# Patient Record
Sex: Male | Born: 1975 | Race: White | Hispanic: Yes | Marital: Married | Smoking: Never smoker
Health system: Southern US, Community
[De-identification: ages and names within clinical notes are randomized; demographics above are authoritative.]

---

## 2007-03-20 ENCOUNTER — Ambulatory Visit: Payer: Self-pay | Admitting: Family Medicine

## 2007-03-22 ENCOUNTER — Ambulatory Visit: Payer: Self-pay | Admitting: *Deleted

## 2007-10-25 ENCOUNTER — Ambulatory Visit: Payer: Self-pay | Admitting: Internal Medicine

## 2007-11-17 ENCOUNTER — Ambulatory Visit: Payer: Self-pay | Admitting: Internal Medicine

## 2007-12-13 ENCOUNTER — Encounter: Admission: RE | Admit: 2007-12-13 | Discharge: 2008-01-02 | Payer: Self-pay | Admitting: Family Medicine

## 2009-01-08 ENCOUNTER — Ambulatory Visit: Payer: Self-pay | Admitting: Internal Medicine

## 2009-03-10 ENCOUNTER — Ambulatory Visit (HOSPITAL_COMMUNITY): Admission: RE | Admit: 2009-03-10 | Discharge: 2009-03-10 | Payer: Self-pay | Admitting: Internal Medicine

## 2009-05-09 ENCOUNTER — Ambulatory Visit: Payer: Self-pay | Admitting: Internal Medicine

## 2010-04-23 IMAGING — CR DG THORACIC SPINE 2V
4 series · 4 of 4 positions shown · non-contrast
Comparison: None

CLINICAL DATA: Chronic low T-spine pain.  No known injury.

THORACIC SPINE - 2 VIEW

[t t-spine a.p.]
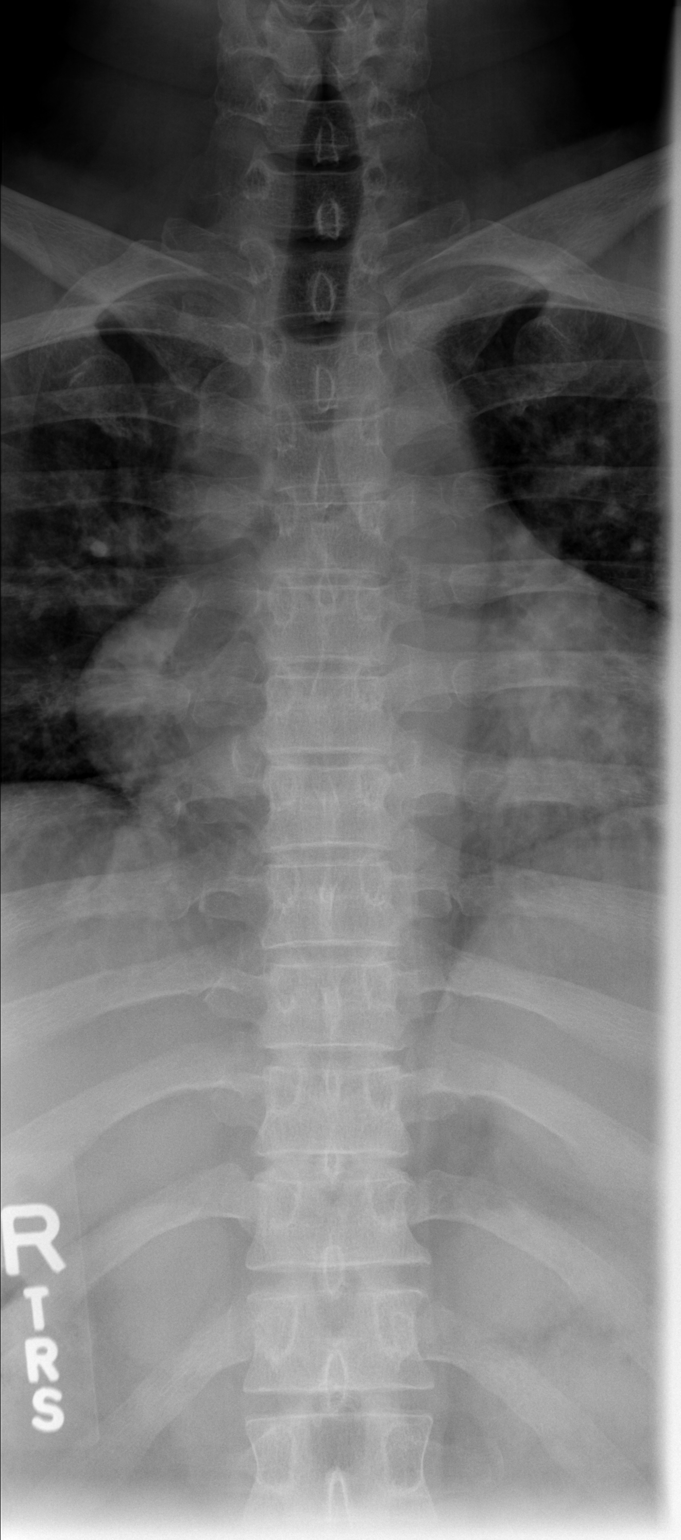

[t t-spine lat *]
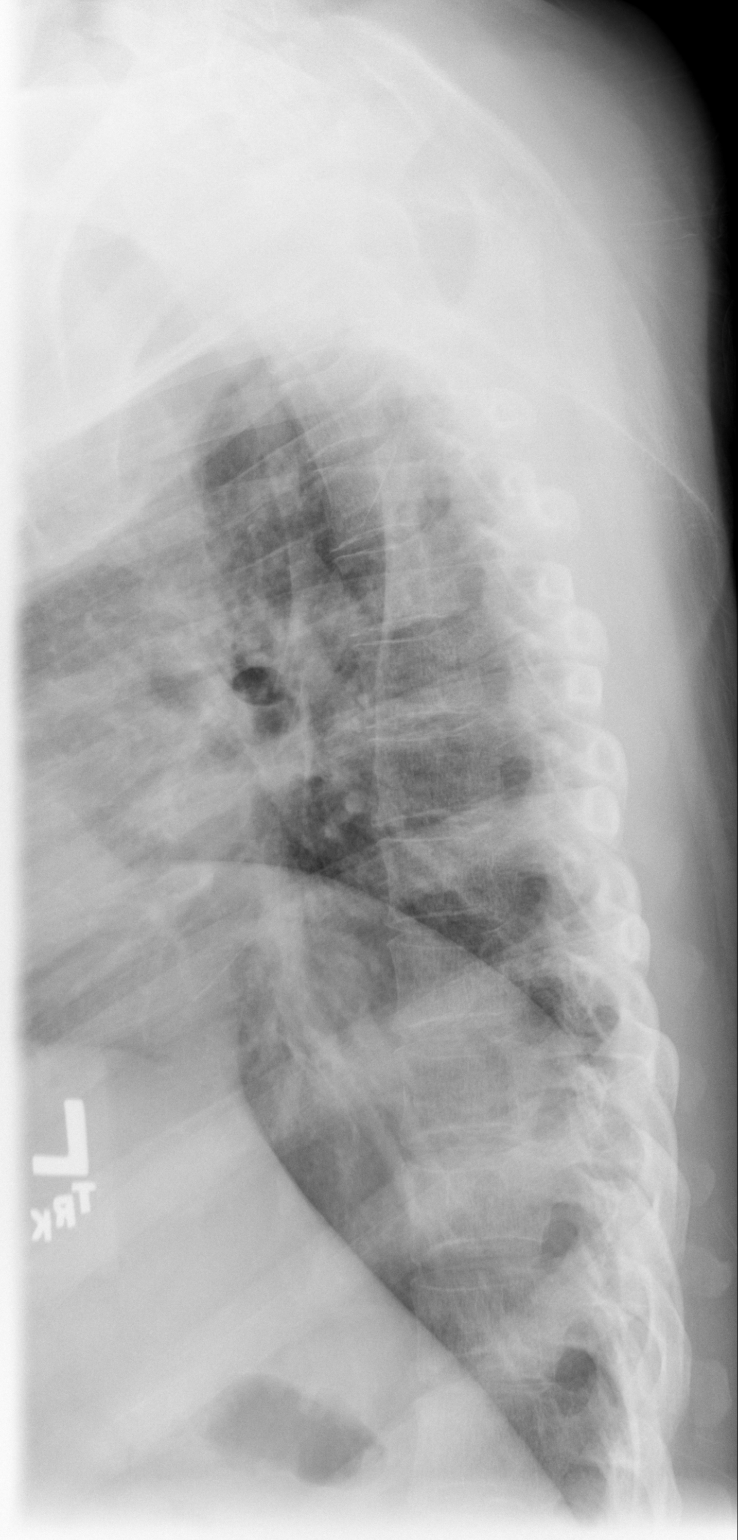

[t swimmers * (1 of 2)]
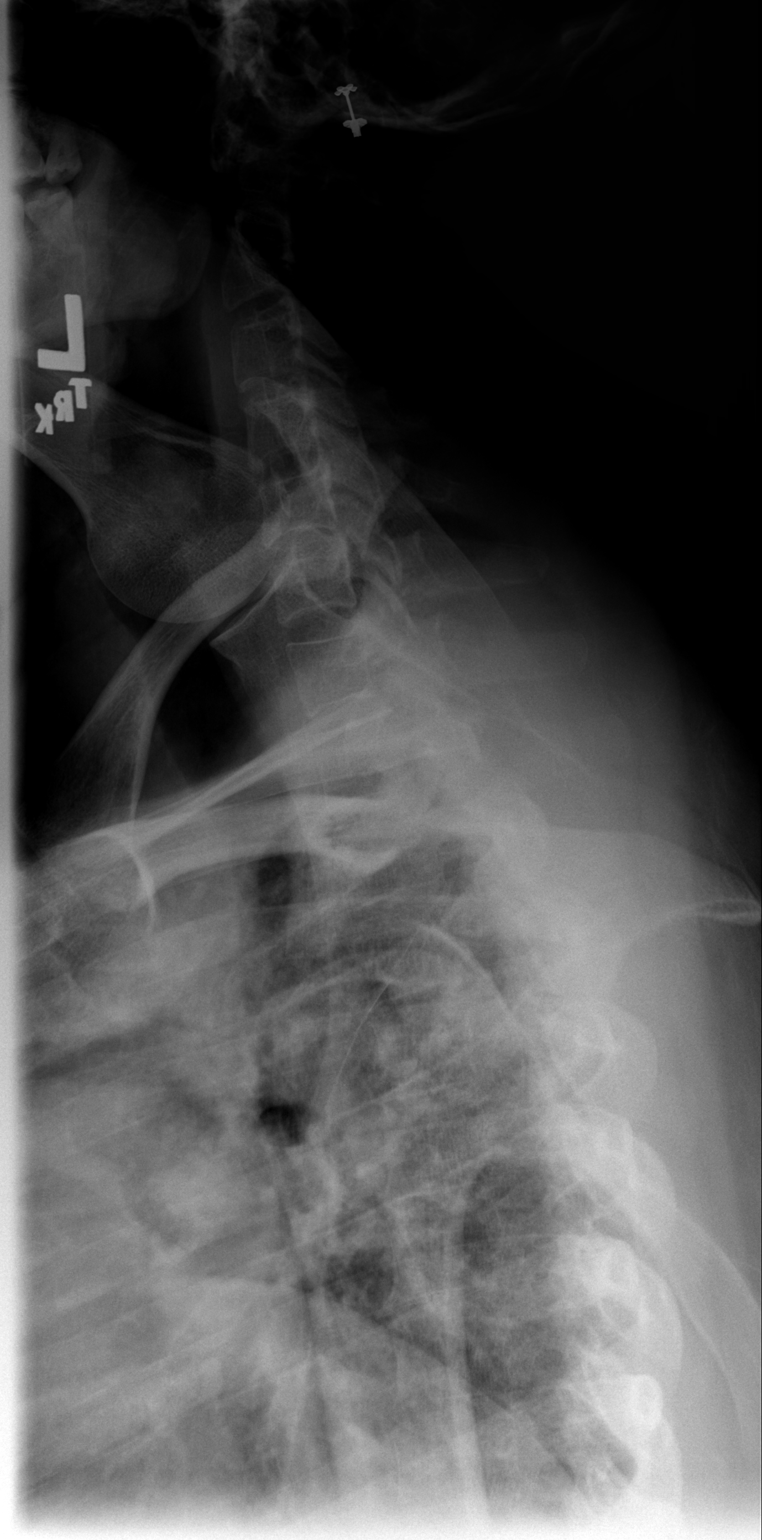

[t swimmers * (2 of 2)]
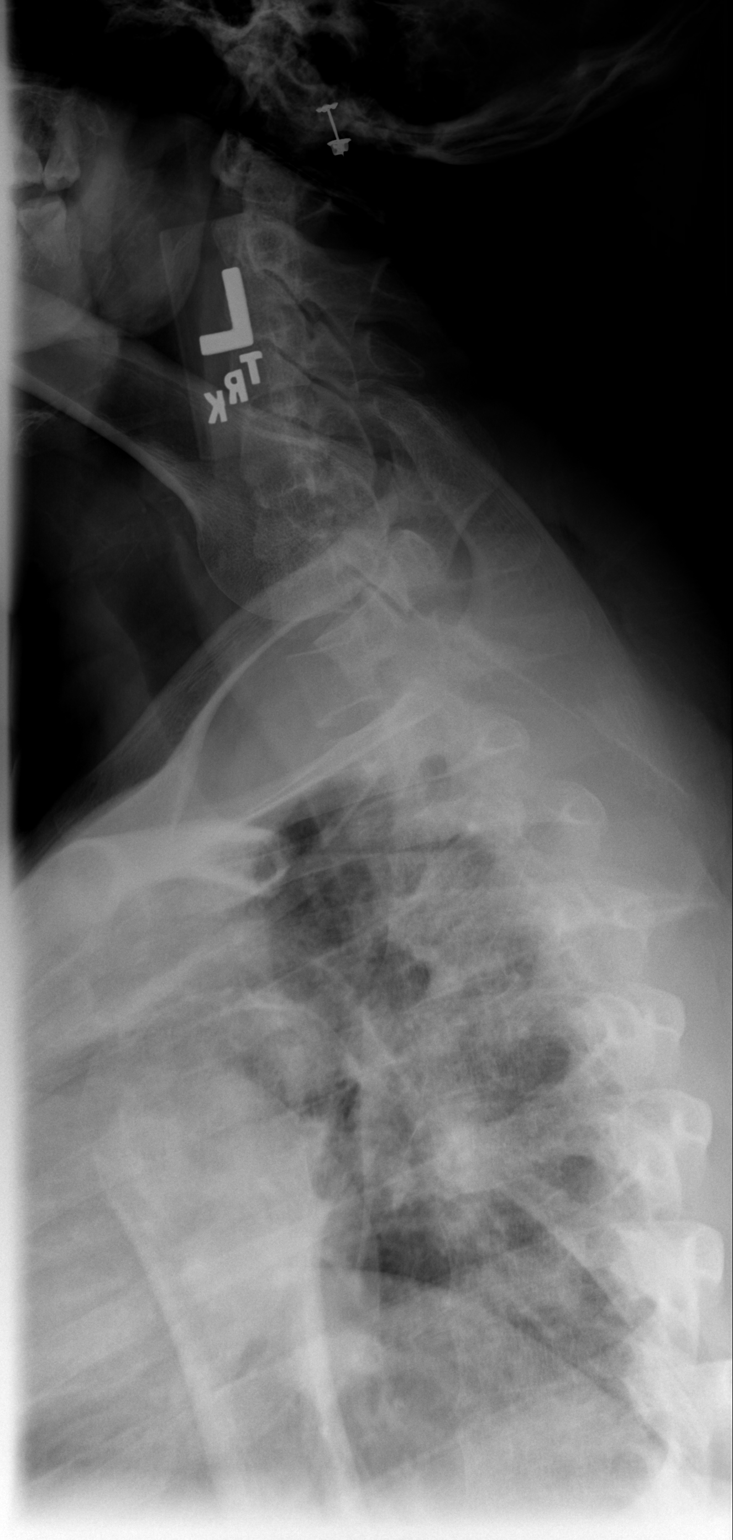

[4 of 4 positions shown; findings below may reference images not displayed]

FINDINGS: No acute bony abnormality.  Specifically, no fracture or
malalignment.  No significant degenerative disease.
IMPRESSION: Negative.

## 2019-02-04 ENCOUNTER — Encounter (HOSPITAL_COMMUNITY): Payer: Self-pay

## 2019-02-04 ENCOUNTER — Other Ambulatory Visit: Payer: Self-pay

## 2019-02-04 ENCOUNTER — Emergency Department (HOSPITAL_COMMUNITY)
Admission: EM | Admit: 2019-02-04 | Discharge: 2019-02-04 | Disposition: A | Discharge status: Home / Self Care | Payer: Self-pay | Attending: Emergency Medicine | Admitting: Emergency Medicine

## 2019-02-04 DIAGNOSIS — Y999 Unspecified external cause status: Secondary | ICD-10-CM | POA: Insufficient documentation

## 2019-02-04 DIAGNOSIS — Y939 Activity, unspecified: Secondary | ICD-10-CM | POA: Insufficient documentation

## 2019-02-04 DIAGNOSIS — Z7982 Long term (current) use of aspirin: Secondary | ICD-10-CM | POA: Insufficient documentation

## 2019-02-04 DIAGNOSIS — S0501XA Injury of conjunctiva and corneal abrasion without foreign body, right eye, initial encounter: Secondary | ICD-10-CM | POA: Insufficient documentation

## 2019-02-04 DIAGNOSIS — H1131 Conjunctival hemorrhage, right eye: Secondary | ICD-10-CM | POA: Insufficient documentation

## 2019-02-04 DIAGNOSIS — W228XXA Striking against or struck by other objects, initial encounter: Secondary | ICD-10-CM | POA: Insufficient documentation

## 2019-02-04 DIAGNOSIS — Y92007 Garden or yard of unspecified non-institutional (private) residence as the place of occurrence of the external cause: Secondary | ICD-10-CM | POA: Insufficient documentation

## 2019-02-04 DIAGNOSIS — Z79899 Other long term (current) drug therapy: Secondary | ICD-10-CM | POA: Insufficient documentation

## 2019-02-04 MED ORDER — POLYMYXIN B-TRIMETHOPRIM 10000-0.1 UNIT/ML-% OP SOLN
1.0000 [drp] | OPHTHALMIC | Status: DC
  Filled 2019-02-04: qty 10

## 2019-02-04 MED ORDER — TETRACAINE HCL 0.5 % OP SOLN
2.0000 [drp] | Freq: Once | OPHTHALMIC | Status: AC
  Administered 2019-02-04: 2 [drp] via OPHTHALMIC
  Filled 2019-02-04: qty 4

## 2019-02-04 MED ORDER — IBUPROFEN 800 MG PO TABS
800.0000 mg | ORAL_TABLET | Freq: Once | ORAL | Status: AC
  Administered 2019-02-04: 800 mg via ORAL
  Filled 2019-02-04: qty 1

## 2019-02-04 MED ORDER — FLUORESCEIN SODIUM 1 MG OP STRP
1.0000 | ORAL_STRIP | Freq: Once | OPHTHALMIC | Status: AC
  Administered 2019-02-04: 1 via OPHTHALMIC
  Filled 2019-02-04: qty 1

## 2019-02-04 MED ORDER — ERYTHROMYCIN 5 MG/GM OP OINT
TOPICAL_OINTMENT | Freq: Once | OPHTHALMIC | Status: AC
  Administered 2019-02-04: 16:00:00 via OPHTHALMIC
  Filled 2019-02-04: qty 3.5

## 2019-02-04 MED ORDER — IBUPROFEN 600 MG PO TABS: 600.0000 mg | ORAL_TABLET | Freq: Four times a day (QID) | ORAL | 0 refills | Status: AC | PRN

## 2019-02-04 MED ORDER — ERYTHROMYCIN 5 MG/GM OP OINT: TOPICAL_OINTMENT | Freq: Three times a day (TID) | OPHTHALMIC | 0 refills | Status: AC

## 2019-02-04 NOTE — ED Triage Notes (Signed)
Pt was hit in the right eye with a stick he was chopping. Obvious eye injury.

## 2019-02-04 NOTE — ED Provider Notes (Signed)
[facility]-EMERGENCY DEPT Provider Note   CSN: [id number] Arrival date & time: 02/04/19  1257     History   Chief Complaint Chief Complaint  Patient presents with  . Eye Pain    HPI Noah Cross is a 43 y.o. male.  The history is provided by the patient. The history is limited by a language barrier. A language interpreter was used.  Eye Pain  Pertinent negatives include no headaches.     43 year old male presented to ED for evaluation of eye injury.  Patient is Hispanic speaking, history obtained through language interpreter.  Patient report he was walking outside in his yard today cutting tree limb when a branch swung and struck his R eye.  He report acute onset of sharp pain to the eye.  Pain is currently 10/10, incident happened 1 hr ago, no specific treatment tried.  He does not wear contact lens or protective eye care.  He is UTD with tetanus.  He does report mild discomfort with eye movement but denies limited ROM of the eye.  No other injury.  No diplopia or loss of vision.    History reviewed. No pertinent past medical history.  There are no active problems to display for this patient.   History reviewed. No pertinent surgical history.      Home Medications    Prior to Admission medications   Medication Sig Start Date End Date Taking? Authorizing Provider  aspirin 81 MG chewable tablet Chew 81 mg by mouth daily.   Yes [provider]  atorvastatin (LIPITOR) 40 MG tablet Take 40 mg by mouth daily. 09/06/18  Yes [provider]  GARCINIA CAMBOGIA-CHROMIUM PO Take 2 tablets by mouth 3 (three) times daily.   Yes [provider]    Family History No family history on file.  Social History Social History   Tobacco Use  . Smoking status: Never Smoker  . Smokeless tobacco: Never Used  Substance Use Topics  . Alcohol use: Never    Frequency: Never  . Drug use: Never     Allergies   Patient has no known  allergies.   Review of Systems Review of Systems  Eyes: Positive for pain and redness. Negative for discharge and visual disturbance.  Skin: Positive for wound.  Neurological: Negative for headaches.     Physical Exam Updated Vital Signs BP 128/85   Pulse 67   Temp 98.9 F (37.2 C) (Oral)   Resp 16   Wt 89.9 kg   SpO2 100%   Physical Exam Vitals signs and nursing note reviewed.  Constitutional:      General: He is not in acute distress.    Appearance: He is well-developed.  HENT:     Head: Atraumatic.  Eyes:     General: Lids are normal. Lids are everted, no foreign bodies appreciated. Vision grossly intact. Gaze aligned appropriately.        Right eye: No foreign body, discharge or hordeolum.     Intraocular pressure: Right eye pressure is 20 mmHg. Measurements were taken using a handheld tonometer.    Extraocular Movements: Extraocular movements intact.     Right eye: Normal extraocular motion and no nystagmus.     Conjunctiva/sclera:     Right eye: Right conjunctiva is injected. No chemosis or exudate.    Pupils: Pupils are unequal.     Right eye: Pupil is not reactive. Pupil is round. Corneal abrasion and fluorescein uptake present. Seidel exam negative.  Slit lamp exam:    Right eye: Photophobia present. No corneal flare, corneal ulcer, foreign body, hyphema, hypopyon, anterior chamber bulge or anterior chamber flares.   Neck:     Musculoskeletal: Neck supple.  Skin:    Findings: No rash.  Neurological:     Mental Status: He is alert and oriented to person, place, and time.      ED Treatments / Results  Labs (all labs ordered are listed, but only abnormal results are displayed) Labs Reviewed - No data to display  EKG None  Radiology No results found.  Procedures Procedures (including critical care time)  Medications Ordered in ED Medications  erythromycin ophthalmic ointment (has no administration in time range)  tetracaine (PONTOCAINE) 0.5  % ophthalmic solution 2 drop (2 drops Both Eyes Given 02/04/19 1407)  fluorescein ophthalmic strip 1 strip (1 strip Both Eyes Given 02/04/19 1409)  ibuprofen (ADVIL,MOTRIN) tablet 800 mg (800 mg Oral Given 02/04/19 1407)     Initial Impression / Assessment and Plan / ED Course  I have reviewed the triage vital signs and the nursing notes.  Pertinent labs & imaging results that were available during my care of the patient were reviewed by me and considered in my medical decision making (see chart for details).     BP 128/85   Pulse 67   Temp 98.9 F (37.2 C) (Oral)   Resp 16   Wt 89.9 kg   SpO2 100%    Final Clinical Impressions(s) / ED Diagnoses   Final diagnoses:  Abrasion of right cornea, initial encounter  Traumatic subconjunctival hemorrhage, right    ED Discharge Orders         Ordered    erythromycin ophthalmic ointment  3 times daily     02/04/19 1506    ibuprofen (ADVIL,MOTRIN) 600 MG tablet  Every 6 hours PRN     02/04/19 1506         2:09 PM Patient injured her right eye when a tree struck his.  No external injury noted.  Normal upper and lower lid and no tenderness along the orbital globe.  Pupils 64mm and not reactive to light on the R eye, L eye normal.  No FB noted, negative Seidel sign, no obvious hyphema, and IOP 20.  I did discussed with oncall ophalmologist DR. Mincey who recommend erythromycin ointment TID and for pt to f/u in office tomorrow for close follow up.     Fayrene Helper, PA-C 02/04/19 1509    Samuel Jester, DO 02/05/19 1507

## 2019-02-04 NOTE — Discharge Instructions (Signed)
Please apply erythromycin ointment to right lower eyelid three times daily for 1 week.  Follow up at Dr. Benard Rink office tomorrow for further evaluation of your eye injury.  Wear protective glasses. Take ibuprofen as needed for pain.

## 2019-12-06 ENCOUNTER — Other Ambulatory Visit: Payer: Self-pay

## 2019-12-06 DIAGNOSIS — Z20822 Contact with and (suspected) exposure to covid-19: Secondary | ICD-10-CM

## 2019-12-08 LAB — NOVEL CORONAVIRUS, NAA: SARS-CoV-2, NAA: NOT DETECTED
